# Patient Record
Sex: Male | Born: 1990 | Race: Black or African American | Hispanic: No | Marital: Single
Health system: Southern US, Community
[De-identification: ages and names within clinical notes are randomized; demographics above are authoritative.]

---
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
## 2019-05-10 ENCOUNTER — Other Ambulatory Visit: Payer: Self-pay

## 2019-05-10 ENCOUNTER — Ambulatory Visit: Payer: Self-pay | Attending: Internal Medicine

## 2019-05-10 DIAGNOSIS — Z20822 Contact with and (suspected) exposure to covid-19: Secondary | ICD-10-CM | POA: Insufficient documentation

## 2019-05-12 LAB — NOVEL CORONAVIRUS, NAA: SARS-CoV-2, NAA: NOT DETECTED

## 2019-05-14 ENCOUNTER — Telehealth: Payer: Self-pay

## 2019-05-14 NOTE — Telephone Encounter (Signed)
Pt notified of negative COVID-19 results. Understanding verbalized.  Kyle Maddox   

## 2021-01-22 ENCOUNTER — Ambulatory Visit (INDEPENDENT_AMBULATORY_CARE_PROVIDER_SITE_OTHER): Payer: 59 | Admitting: Podiatry

## 2021-01-22 ENCOUNTER — Encounter: Payer: Self-pay | Admitting: Podiatry

## 2021-01-22 ENCOUNTER — Ambulatory Visit (INDEPENDENT_AMBULATORY_CARE_PROVIDER_SITE_OTHER): Payer: 59

## 2021-01-22 ENCOUNTER — Other Ambulatory Visit: Payer: Self-pay

## 2021-01-22 VITALS — BP 131/79 | HR 78 | Temp 97.9°F

## 2021-01-22 DIAGNOSIS — M7671 Peroneal tendinitis, right leg: Secondary | ICD-10-CM

## 2021-01-22 DIAGNOSIS — M7750 Other enthesopathy of unspecified foot: Secondary | ICD-10-CM

## 2021-01-22 MED ORDER — MELOXICAM 15 MG PO TABS: 15.0000 mg | ORAL_TABLET | Freq: Every day | ORAL | 0 refills | Status: DC

## 2021-01-22 NOTE — Patient Instructions (Signed)
Peroneal Tendinopathy Rehab ?Ask your health care provider which exercises are safe for you. Do exercises exactly as told by your health care provider and adjust them as directed. It is normal to feel mild stretching, pulling, tightness, or discomfort as you do these exercises. Stop right away if you feel sudden pain or your pain gets worse. Do not begin these exercises until told by your health care provider. ?Stretching and range-of-motion exercises ?These exercises warm up your muscles and joints and improve the movement and flexibility of your ankle. These exercises also help to relieve pain and stiffness. ?Gastroc and soleus stretch, standing ?This is an exercise in which you stand on a step and use your body weight to stretch your calf muscles. To do this exercise: ?Stand on the edge of a step on the ball of your left / right foot. The ball of your foot is on the walking surface, right under your toes. ?Keep your other foot firmly on the same step. ?Hold on to the wall, a railing, or a chair for balance. ?Slowly lift your other foot, allowing your body weight to press your left / right heel down over the edge of the step. You should feel a stretch in your left / right calf (gastrocnemius and soleus). ?Hold this position for __________ seconds. ?Return both feet to the step. ?Repeat this exercise with a slight bend in your left / right knee. ?Repeat __________ times with your left / right knee straight and __________ times with your left / right knee bent. Complete this exercise __________ times a day. ?Strengthening exercises ?These exercises build strength and endurance in your foot and ankle. Endurance is the ability to use your muscles for a long time, even after they get tired. ?Ankle dorsiflexion with band ? ?Secure a rubber exercise band or tube to an object, such as a table leg, that will not move when the band is pulled. ?Secure the other end of the band around your left / right foot. ?Sit on the  floor, facing the object with your left / right leg extended. The band or tube should be slightly tense when your foot is relaxed. ?Slowly flex your left / right ankle and toes to bring your foot toward you (dorsiflexion). ?Hold this position for __________ seconds. ?Let the band or tube slowly pull your foot back to the starting position. ?Repeat __________ times. Complete this exercise __________ times a day. ?Ankle eversion ?Sit on the floor with your legs straight out in front of you. ?Loop a rubber exercise band or tube around the ball of your left / right foot. The ball of your foot is on the walking surface, right under your toes. ?Hold the ends of the band in your hands, or secure the band to a stable object. The band or tube should be slightly tense when your foot is relaxed. ?Slowly push your foot outward, away from your other leg (eversion). ?Hold this position for __________ seconds. ?Slowly return your foot to the starting position. ?Repeat __________ times. Complete this exercise __________ times a day. ?Plantar flexion, standing ?This exercise is sometimes called standing heel raise. ?Stand with your feet shoulder-width apart. ?Place your hands on a wall or table to steady yourself as needed, but try not to use it for support. ?Keep your weight spread evenly over the width of your feet while you slowly rise up on your toes (plantar flexion). If told by your health care provider: ?Shift your weight toward your left / right   leg until you feel challenged. ?Stand on your left / right leg only. ?Hold this position for __________ seconds. ?Repeat __________ times. Complete this exercise __________ times a day. ?Single leg stand ?Without shoes, stand near a railing or in a doorway. You may hold on to the railing or door frame as needed. ?Stand on your left / right foot. Keep your big toe down on the floor and try to keep your arch lifted. ?Do not roll to the outside of your foot. ?If this exercise is too  easy, you can try it with your eyes closed or while standing on a pillow. ?Hold this position for __________ seconds. ?Repeat __________ times. Complete this exercise __________ times a day. ?This information is not intended to replace advice given to you by your health care provider. Make sure you discuss any questions you have with your health care provider. ?Document Revised: 08/01/2018 Document Reviewed: 08/01/2018 ?Elsevier Patient Education ? 2022 [employer] ? ?

## 2021-01-22 NOTE — Progress Notes (Signed)
  Subjective:  Patient ID: Kyle Maddox, male    DOB: 02-06-1991,   MRN: [id number]  Chief Complaint  Patient presents with   tendinitis    Patient stated a few months ago he notice pain the right lateral ankle. Went to PCP who stated it was a strained tendin in the area. Patient has tried the exercises advised by PCP but the ankle pain keeps coming back.     30 y.o. male presents for right ankle pain that has been going on since June. States it came on suddenly but denies any injury. States it is aching in nature and painful with activities on side of foot. States he was seen by PCP and given stretching exercises.  Denies any other pedal complaints. Denies n/v/f/c.   History reviewed. No pertinent past medical history.  Objective:  Physical Exam: Vascular: DP/PT pulses 2/4 bilateral. CFT <3 seconds. Normal hair growth on digits. No edema.  Skin. No lacerations or abrasions bilateral feet.  Musculoskeletal: MMT 5/5 bilateral lower extremities in DF, PF, Inversion and Eversion. Deceased ROM in DF of ankle joint. Tenderness with eversion of foot and palpation to insertion of peroneal tendon on right. Decreased STJ ROM on right.  Neurological: Sensation intact to light touch.   Assessment:   1. Tendonitis of peroneus brevis tendon, right      Plan:  Patient was evaluated and treated and all questions answered. X-rays reviewed and discussed with patient. No acute fractures or dislocations noted. 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  Discussed peroneal tendinitis and treatment options at length with patient Discussed stretching exercises and provided handout. Prescription for meloxicam provided Dispensed Tri-Lock ankle brace. Discussed that if the symptoms do not improve can consider PT/MRI. Patient to return in 4-6 weeks or sooner if symptoms fail to improve or worsen.   Louann Sjogren, DPM

## 2021-01-23 ENCOUNTER — Other Ambulatory Visit: Payer: Self-pay | Admitting: Podiatry

## 2021-01-23 DIAGNOSIS — M7671 Peroneal tendinitis, right leg: Secondary | ICD-10-CM

## 2021-03-02 ENCOUNTER — Ambulatory Visit (INDEPENDENT_AMBULATORY_CARE_PROVIDER_SITE_OTHER): Payer: 59 | Admitting: Podiatry

## 2021-03-02 ENCOUNTER — Encounter: Payer: Self-pay | Admitting: Podiatry

## 2021-03-02 ENCOUNTER — Other Ambulatory Visit: Payer: Self-pay

## 2021-03-02 DIAGNOSIS — M7671 Peroneal tendinitis, right leg: Secondary | ICD-10-CM

## 2021-03-02 MED ORDER — MELOXICAM 15 MG PO TABS: 15.0000 mg | ORAL_TABLET | Freq: Every day | ORAL | 0 refills | Status: DC

## 2021-03-02 NOTE — Progress Notes (Signed)
  Subjective:  Patient ID: Kyle Maddox, male    DOB: 1990/08/05,   MRN: [id number]  Chief Complaint  Patient presents with   Foot Pain    My right ankle is better and doing the exercises and the pills are helping and I am done     30 y.o. male presents for follow-up of peroneal tendonitis. Relates it is doing better. Still has some issues but the brace has helped as well as the meloxicam and stretching. Denies any injuries or changes.  Denies any other pedal complaints. Denies n/v/f/c.   History reviewed. No pertinent past medical history.  Objective:  Physical Exam: Vascular: DP/PT pulses 2/4 bilateral. CFT <3 seconds. Normal hair growth on digits. No edema.  Skin. No lacerations or abrasions bilateral feet.  Musculoskeletal: MMT 5/5 bilateral lower extremities in DF, PF, Inversion and Eversion. Deceased ROM in DF of ankle joint. Minimal tendernesswith eversion of foot and palpation to insertion of peroneal tendon on right. Decreased STJ ROM on right.  Neurological: Sensation intact to light touch.   Assessment:   1. Tendonitis of peroneus brevis tendon, right      Plan:  Patient was evaluated and treated and all questions answered. X-rays reviewed and discussed with patient. No acute fractures or dislocations noted. 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  Discussed peroneal tendinitis and treatment options at length with patient Continue stretching.  Refill for meloxicam provided.  Continue with brace.  Patient to return as needed.    Louann Sjogren, DPM

## 2021-07-15 ENCOUNTER — Other Ambulatory Visit: Payer: Self-pay

## 2021-07-15 ENCOUNTER — Ambulatory Visit (INDEPENDENT_AMBULATORY_CARE_PROVIDER_SITE_OTHER): Payer: BC Managed Care – PPO | Admitting: Podiatry

## 2021-07-15 DIAGNOSIS — M7671 Peroneal tendinitis, right leg: Secondary | ICD-10-CM

## 2021-07-15 DIAGNOSIS — M25571 Pain in right ankle and joints of right foot: Secondary | ICD-10-CM | POA: Diagnosis not present

## 2021-07-15 MED ORDER — DEXAMETHASONE SODIUM PHOSPHATE 120 MG/30ML IJ SOLN
4.0000 mg | Freq: Once | INTRAMUSCULAR | Status: AC
  Administered 2021-07-15: 4 mg via INTRA_ARTICULAR

## 2021-07-15 MED ORDER — MELOXICAM 15 MG PO TABS: 15.0000 mg | ORAL_TABLET | Freq: Every day | ORAL | 0 refills | Status: AC

## 2021-07-15 NOTE — Progress Notes (Signed)
?  Subjective:  ?Patient ID: Kyle Maddox, male    DOB: 1991-01-24,   MRN: [id number] ? ?Chief Complaint  ?Patient presents with  ? Foot Pain  ?  R peroneal tendonitis  ? ? ?31 y.o. male presents for follow-up of peroneal tendonitis. Relates it  has not improved since last visit.  Has not been wearing the brace and using inserts from [employer]. Denies any injuries or changes.  Denies any other pedal complaints. Denies n/v/f/c.  ? ?No past medical history on file. ? ?Objective:  ?Physical Exam: ?Vascular: DP/PT pulses 2/4 bilateral. CFT <3 seconds. Normal hair growth on digits. No edema.  ?Skin. No lacerations or abrasions bilateral feet.  ?Musculoskeletal: MMT 5/5 bilateral lower extremities in DF, PF, Inversion and Eversion. Deceased ROM in DF of ankle joint. Minimal tendernesswith eversion of foot and palpation to insertion of peroneal tendon on right. Pain over the sinus tarsi area as well today.  Decreased STJ ROM on right.  ?Neurological: Sensation intact to light touch.  ? ?Assessment:  ? ?1. 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 ? ? ?Plan:  ?Patient was evaluated and treated and all questions answered. ?X-rays reviewed and discussed with patient. No acute fractures or dislocations noted. 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  ?Discussed peroneal tendinitis and treatment options at length with patient ?Amb ref PT  ?Injection provided today. Procedure below.  ?Refill for meloxicam provided.  ?Continue with brace.  ?Patient to return in 9 weeks for re-evaluation  ? ? ?Procedure: Injection Tendon/Ligament ?Discussed alternatives, risks, complications and verbal consent was obtained.  ?Location: Right sinus tarsi. ?Skin Prep: Alcohol. ?Injectate: 1cc 0.5% marcaine plain, 1 cc dexamethasone.  ?Disposition: Patient tolerated procedure well. Injection site dressed with a band-aid.  ?Post-injection care was discussed and return precautions  discussed.  ? ? ?Louann Sjogren, DPM  ? ? ?

## 2021-09-15 ENCOUNTER — Encounter: Payer: Self-pay | Admitting: Podiatry

## 2021-09-15 ENCOUNTER — Ambulatory Visit: Payer: BC Managed Care – PPO | Admitting: Podiatry

## 2021-09-15 DIAGNOSIS — M7671 Peroneal tendinitis, right leg: Secondary | ICD-10-CM | POA: Diagnosis not present

## 2021-09-15 DIAGNOSIS — M25571 Pain in right ankle and joints of right foot: Secondary | ICD-10-CM | POA: Diagnosis not present

## 2021-09-15 NOTE — Progress Notes (Signed)
  Subjective:  Patient ID: Kyle Maddox, male    DOB: [birth year],   MRN: [id number]  No chief complaint on file.   31 y.o. male presents for follow-up of peroneal tendonitis. Relates some improvement and states about 75% better. Has been stretching from PT and wearing brace and states meloxicam helps.  Denies any other pedal complaints. Denies n/v/f/c.   History reviewed. No pertinent past medical history.  Objective:  Physical Exam: Vascular: DP/PT pulses 2/4 bilateral. CFT <3 seconds. Normal hair growth on digits. No edema.  Skin. No lacerations or abrasions bilateral feet.  Musculoskeletal: MMT 5/5 bilateral lower extremities in DF, PF, Inversion and Eversion. Deceased ROM in DF of ankle joint. Minimal tendernesswith eversion of foot and palpation to insertion of peroneal tendon on right. Pain over the sinus tarsi area as well today.  Improved. Decreased STJ ROM on right.  Neurological: Sensation intact to light touch.   Assessment:   1. 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 X-rays reviewed and discussed with patient. No acute fractures or dislocations noted. 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  Discussed peroneal tendinitis and treatment options at length with patient Continue meloxicam and stretching.  Continue with brace.  Patient to return ias needed if things change.    Louann Sjogren, DPM

## 2022-04-02 ENCOUNTER — Ambulatory Visit: Payer: BC Managed Care – PPO | Admitting: Podiatry

## 2022-04-02 VITALS — BP 131/84 | HR 81

## 2022-04-02 DIAGNOSIS — M25571 Pain in right ankle and joints of right foot: Secondary | ICD-10-CM | POA: Diagnosis not present

## 2022-04-02 DIAGNOSIS — T148XXA Other injury of unspecified body region, initial encounter: Secondary | ICD-10-CM

## 2022-04-02 DIAGNOSIS — M7671 Peroneal tendinitis, right leg: Secondary | ICD-10-CM | POA: Diagnosis not present

## 2022-04-04 NOTE — Progress Notes (Signed)
Subjective:   Patient ID: Kyle Maddox, male   DOB: 31 y.o.   MRN: [id number]   HPI Patient presents stating that his right ankle has really been hurting him and that he has tried different modalities without relief and what been done previously has not been effective   ROS      Objective:  Physical Exam  Neurovascular status intact patient who is having a lot of pain in the right sinus tarsi with inflammation fluid buildup and has diminished range of motion of the subtalar midtarsal joint and the possibility for some inflammation.  Or fraying of acute peroneal tendon right     Assessment:  Strong possibility for arthritis subtalar joint with possibility for subtalar joint coalition with patient also having possibility for peroneal tendon injury     Plan:  With failure to respond conservatively at this point I have recommended MRI to try to ascertain both tendon and bone structure.  I reviewed this with him and patient will be scheduled for this by me and then I will get results and we will review them and decide what will be best moving forward  X-rays indicate quite a bit of talar beaking and narrowing around the subtalar joint

## 2022-04-29 ENCOUNTER — Encounter: Payer: Self-pay | Admitting: Podiatry

## 2022-05-01 ENCOUNTER — Ambulatory Visit
Admission: RE | Admit: 2022-05-01 | Discharge: 2022-05-01 | Disposition: A | Discharge status: Home / Self Care | Payer: BC Managed Care – PPO | Source: Ambulatory Visit | Attending: Podiatry | Admitting: Podiatry

## 2022-05-01 DIAGNOSIS — M25571 Pain in right ankle and joints of right foot: Secondary | ICD-10-CM

## 2022-05-01 DIAGNOSIS — T148XXA Other injury of unspecified body region, initial encounter: Secondary | ICD-10-CM

## 2022-05-03 NOTE — Progress Notes (Signed)
One of you guys may want to see this guy. Not sure if the peroneal pathology is his only problem

## 2022-05-13 ENCOUNTER — Telehealth: Payer: Self-pay | Admitting: Podiatry

## 2022-05-13 NOTE — Telephone Encounter (Signed)
Called patient to review CT scan results, left VM to call back. He will need a follow up CT prior to follow up visit

## 2022-05-18 ENCOUNTER — Encounter: Payer: Self-pay | Admitting: Podiatry

## 2022-05-18 ENCOUNTER — Ambulatory Visit: Payer: Self-pay | Admitting: Podiatry

## 2022-05-18 DIAGNOSIS — M79676 Pain in unspecified toe(s): Secondary | ICD-10-CM

## 2022-05-18 DIAGNOSIS — S86311A Strain of muscle(s) and tendon(s) of peroneal muscle group at lower leg level, right leg, initial encounter: Secondary | ICD-10-CM

## 2022-05-18 DIAGNOSIS — M25371 Other instability, right ankle: Secondary | ICD-10-CM

## 2022-05-18 NOTE — Patient Instructions (Signed)
Call [phone] to schedule physical therapy   Peroneal Tendinopathy Rehab Ask your health care provider which exercises are safe for you. Do exercises exactly as told by your health care provider and adjust them as directed. It is normal to feel mild stretching, pulling, tightness, or discomfort as you do these exercises. Stop right away if you feel sudden pain or your pain gets worse. Do not begin these exercises until told by your health care provider. Stretching and range-of-motion exercises These exercises warm up your muscles and joints and improve the movement and flexibility of your ankle. These exercises also help to relieve pain and stiffness. Gastroc and soleus stretch, standing  This is an exercise in which you stand on a step and use your body weight to stretch your calf muscles. To do this exercise: Stand on the edge of a step on the ball of your left / right foot. The ball of your foot is on the walking surface, right under your toes. Keep your other foot firmly on the same step. Hold on to the wall, a railing, or a chair for balance. Slowly lift your other foot, allowing your body weight to press your left / right heel down over the edge of the step. You should feel a stretch in your left / right calf (gastrocnemius and soleus). Hold this position for 15 seconds. Return both feet to the step. Repeat this exercise with a slight bend in your left / right knee. Repeat 5 times with your left / right knee straight and 5 times with your left / right knee bent. Complete this exercise 2 times a day. Strengthening exercises These exercises build strength and endurance in your foot and ankle. Endurance is the ability to use your muscles for a long time, even after they get tired. Ankle dorsiflexion with band   Secure a rubber exercise band or tube to an object, such as a table leg, that will not move when the band is pulled. Secure the other end of the band around your left / right  foot. Sit on the floor, facing the object with your left / right leg extended. The band or tube should be slightly tense when your foot is relaxed. Slowly flex your left / right ankle and toes to bring your foot toward you (dorsiflexion). Hold this position for 15 seconds. Let the band or tube slowly pull your foot back to the starting position. Repeat 5 times. Complete this exercise 2 times a day. Ankle eversion Sit on the floor with your legs straight out in front of you. Loop a rubber exercise band or tube around the ball of your left / right foot. The ball of your foot is on the walking surface, right under your toes. Hold the ends of the band in your hands, or secure the band to a stable object. The band or tube should be slightly tense when your foot is relaxed. Slowly push your foot outward, away from your other leg (eversion). Hold this position for 15 seconds. Slowly return your foot to the starting position. Repeat 5 times. Complete this exercise 2 times a day. Plantar flexion, standing  This exercise is sometimes called standing heel raise. Stand with your feet shoulder-width apart. Place your hands on a wall or table to steady yourself as needed, but try not to use it for support. Keep your weight spread evenly over the width of your feet while you slowly rise up on your toes (plantar flexion). If told by your  health care provider: Shift your weight toward your left / right leg until you feel challenged. Stand on your left / right leg only. Hold this position for 15 seconds. Repeat 2 times. Complete this exercise 2 times a day. Single leg stand Without shoes, stand near a railing or in a doorway. You may hold on to the railing or door frame as needed. Stand on your left / right foot. Keep your big toe down on the floor and try to keep your arch lifted. Do not roll to the outside of your foot. If this exercise is too easy, you can try it with your eyes closed or while standing  on a pillow. Hold this position for 15 seconds. Repeat 5 times. Complete this exercise 2 times a day. This information is not intended to replace advice given to you by your health care provider. Make sure you discuss any questions you have with your health care provider. Document Revised: 08/01/2018 Document Reviewed: 08/01/2018 Elsevier Patient Education  [place].

## 2022-05-22 ENCOUNTER — Encounter: Payer: Self-pay | Admitting: Podiatry

## 2022-05-22 NOTE — Progress Notes (Signed)
Subjective:  Patient ID: Kyle Maddox, male    DOB: 02/12/1991,  MRN: [id number]  Chief Complaint  Patient presents with   Tendonitis    MRI review, right ankle    32 y.o. male presents with the above complaint. History confirmed with patient.  He has been dealing with this for some time, has had injections and an ankle brace.  Has not been in a boot or therapy  Objective:  Physical Exam: warm, good capillary refill, no trophic changes or ulcerative lesions, normal DP and PT pulses, normal sensory exam, and pain and tenderness over the right peroneal tendons and CFL, ATFL, unable to appreciate anterior drawer but he seems to be guarding there is pain with resisted eversion   CLINICAL DATA:  Ankle pain. Tendon abnormality suspected. Evaluate for tarsal coalition, subtalar joint arthritis, or peroneal tear.   EXAM: MRI OF THE RIGHT ANKLE WITHOUT CONTRAST   TECHNIQUE: Multiplanar, multisequence MR imaging of the ankle was performed. No intravenous contrast was administered.   COMPARISON:  Right ankle radiographs 01/22/2021   FINDINGS: TENDONS   Peroneal: There is mild intermediate T2 signal and "chevron configuration" of the peroneus brevis tendon starting just distal to the fibula and involving an approximate 1 cm length of the tendon (axial series 4 images 15 through 17 and coronal series 8 images 16 through 20, a partial-thickness longitudinal tear. The peroneus longus tendon is intact.   Posteromedial: Minimal tibial tenosynovitis. The flexor digitorum longus and flexor hallucis longus tendons are intact.   Anterior: The tibialis anterior, extensor hallucis longus, and extensor digitorum longus tendons are intact.   Achilles: Intact.   Plantar Fascia: Intact.   LIGAMENTS   Lateral: Mild attenuation of the anterior talofibular ligament, which may represent the patient's normal anatomy versus a remote partial-thickness tear. The calcaneofibular, posterior  talofibular, and anterior and posterior tibiofibular ligaments are intact.   Medial: The tibiotalar deep deltoid and tibial spring ligaments are intact.   CARTILAGE   Ankle Joint: Intact cartilage.   Subtalar Joints/Sinus Tarsi: Fat is preserved within sinus tarsi.   Bones: No talocalcaneal or calcaneonavicular coalition is seen. There is mild-to-moderate marrow edema in the lateral aspect of the calcaneus centered at the peroneal tubercle. There is again moderate to high-grade distal dorsal talar neck spurring/beaking. Mild degenerative osteophytosis within the dorsal aspect of the navicular. There is again a chronic well corticated 8 mm ossicle dorsal to the talonavicular joint.   Other: The tarsal tunnel is unremarkable.   IMPRESSION: 1. Moderate to high-grade distal dorsal talar neck spurring/beaking. 2. No talocalcaneal or calcaneonavicular coalition is seen. 3. Partial-thickness longitudinal tear of the peroneus brevis tendon starting just distal to the fibula and involving an approximate 1 cm length of the tendon. 4. Mild-to-moderate marrow edema in the lateral aspect of the calcaneus centered at the peroneal tubercle. 5. Mild attenuation of the anterior talofibular ligament, which may represent the patient's normal anatomy versus a remote partial-thickness tear.     Electronically Signed   By: Yvonne Kendall M.D.   On: 05/03/2022 08:19    Assessment:   1. Peroneal tendon tear, right, initial encounter   2. Ankle instability, right      Plan:  Patient was evaluated and treated and all questions answered.  We reviewed the results of his MRI and his treatment and progress thus far.  We discussed both surgical and nonsurgical treatment.  Ultimately may require surgical intervention.  I do think he has further nonoperative treatment that could  be pursued such as significant physical therapy for ankle stabilization and peroneal tendon strengthening and  immobilization and rest in a cam walker boot.  This was dispensed today.  Referral sent to [facility] in [place].  I will see him back after this and reevaluate.  If no improvement at that point we will likely plan for surgical intervention   Return in about 8 weeks (around 07/13/2022) for re-check peroneal tendon after boot and PT .

## 2022-07-13 ENCOUNTER — Encounter: Payer: Self-pay | Admitting: Podiatry

## 2022-07-13 ENCOUNTER — Ambulatory Visit (INDEPENDENT_AMBULATORY_CARE_PROVIDER_SITE_OTHER): Payer: BC Managed Care – PPO | Admitting: Podiatry

## 2022-07-13 DIAGNOSIS — S86311A Strain of muscle(s) and tendon(s) of peroneal muscle group at lower leg level, right leg, initial encounter: Secondary | ICD-10-CM

## 2022-07-14 NOTE — Progress Notes (Signed)
Subjective:  Patient ID: Kyle Maddox, male    DOB: 07/28/1990,  MRN: [id number]  Chief Complaint  Patient presents with   Tendonitis    MRI review, right ankle    32 y.o. male presents with the above complaint. History confirmed with patient.  He returns for follow-up doing much better not having pain, his physical therapy was very helpful  Objective:  Physical Exam: warm, good capillary refill, no trophic changes or ulcerative lesions, normal DP and PT pulses, normal sensory exam, and today no pain and tender over the peroneal tendons, good 5 out of 5 strength, no instability noted.   CLINICAL DATA:  Ankle pain. Tendon abnormality suspected. Evaluate for tarsal coalition, subtalar joint arthritis, or peroneal tear.   EXAM: MRI OF THE RIGHT ANKLE WITHOUT CONTRAST   TECHNIQUE: Multiplanar, multisequence MR imaging of the ankle was performed. No intravenous contrast was administered.   COMPARISON:  Right ankle radiographs 01/22/2021   FINDINGS: TENDONS   Peroneal: There is mild intermediate T2 signal and "chevron configuration" of the peroneus brevis tendon starting just distal to the fibula and involving an approximate 1 cm length of the tendon (axial series 4 images 15 through 17 and coronal series 8 images 16 through 20, a partial-thickness longitudinal tear. The peroneus longus tendon is intact.   Posteromedial: Minimal tibial tenosynovitis. The flexor digitorum longus and flexor hallucis longus tendons are intact.   Anterior: The tibialis anterior, extensor hallucis longus, and extensor digitorum longus tendons are intact.   Achilles: Intact.   Plantar Fascia: Intact.   LIGAMENTS   Lateral: Mild attenuation of the anterior talofibular ligament, which may represent the patient's normal anatomy versus a remote partial-thickness tear. The calcaneofibular, posterior talofibular, and anterior and posterior tibiofibular ligaments are intact.   Medial: The  tibiotalar deep deltoid and tibial spring ligaments are intact.   CARTILAGE   Ankle Joint: Intact cartilage.   Subtalar Joints/Sinus Tarsi: Fat is preserved within sinus tarsi.   Bones: No talocalcaneal or calcaneonavicular coalition is seen. There is mild-to-moderate marrow edema in the lateral aspect of the calcaneus centered at the peroneal tubercle. There is again moderate to high-grade distal dorsal talar neck spurring/beaking. Mild degenerative osteophytosis within the dorsal aspect of the navicular. There is again a chronic well corticated 8 mm ossicle dorsal to the talonavicular joint.   Other: The tarsal tunnel is unremarkable.   IMPRESSION: 1. Moderate to high-grade distal dorsal talar neck spurring/beaking. 2. No talocalcaneal or calcaneonavicular coalition is seen. 3. Partial-thickness longitudinal tear of the peroneus brevis tendon starting just distal to the fibula and involving an approximate 1 cm length of the tendon. 4. Mild-to-moderate marrow edema in the lateral aspect of the calcaneus centered at the peroneal tubercle. 5. Mild attenuation of the anterior talofibular ligament, which may represent the patient's normal anatomy versus a remote partial-thickness tear.     Electronically Signed   By: Yvonne Kendall M.D.   On: 05/03/2022 08:19    Assessment:   1. Peroneal tendon tear, right, initial encounter   2. Ankle instability, right      Plan:  Patient was evaluated and treated and all questions answered.  Overall doing much better after physical therapy.  May return to regular activity shoe gear and work.  Release letter was written for him.  We discussed if he has difficulty going back for his full shifts that if he needs to go back part-time or for shorter shifts to let me know.  I will see him  back as needed at this point.  Return in about 8 weeks (around 07/13/2022) for re-check peroneal tendon after boot and PT .
